# Patient Record
Sex: Male | Born: 1940 | Race: White | Hispanic: No | Marital: Married | Smoking: Former smoker
Health system: Southern US, Community
[De-identification: ages and names within clinical notes are randomized; demographics above are authoritative.]

## PROBLEM LIST (undated history)

## (undated) DIAGNOSIS — I10 Essential (primary) hypertension: Secondary | ICD-10-CM

## (undated) DIAGNOSIS — E7800 Pure hypercholesterolemia, unspecified: Secondary | ICD-10-CM

## (undated) DIAGNOSIS — G2581 Restless legs syndrome: Secondary | ICD-10-CM

## (undated) DIAGNOSIS — M069 Rheumatoid arthritis, unspecified: Secondary | ICD-10-CM

## (undated) HISTORY — PX: APPENDECTOMY: SHX54

## (undated) HISTORY — PX: PROSTATE SURGERY: SHX751

---

## 2011-09-10 ENCOUNTER — Encounter (HOSPITAL_COMMUNITY): Payer: Self-pay | Admitting: *Deleted

## 2011-09-10 ENCOUNTER — Emergency Department (HOSPITAL_COMMUNITY): Payer: Medicare Other

## 2011-09-10 ENCOUNTER — Emergency Department (HOSPITAL_COMMUNITY)
Admission: EM | Admit: 2011-09-10 | Discharge: 2011-09-10 | Disposition: A | Discharge status: Home / Self Care | Payer: Medicare Other | Attending: Emergency Medicine | Admitting: Emergency Medicine

## 2011-09-10 DIAGNOSIS — I10 Essential (primary) hypertension: Secondary | ICD-10-CM | POA: Insufficient documentation

## 2011-09-10 DIAGNOSIS — J069 Acute upper respiratory infection, unspecified: Secondary | ICD-10-CM | POA: Insufficient documentation

## 2011-09-10 DIAGNOSIS — E789 Disorder of lipoprotein metabolism, unspecified: Secondary | ICD-10-CM | POA: Insufficient documentation

## 2011-09-10 DIAGNOSIS — E119 Type 2 diabetes mellitus without complications: Secondary | ICD-10-CM | POA: Insufficient documentation

## 2011-09-10 DIAGNOSIS — R05 Cough: Secondary | ICD-10-CM | POA: Insufficient documentation

## 2011-09-10 DIAGNOSIS — R059 Cough, unspecified: Secondary | ICD-10-CM | POA: Insufficient documentation

## 2011-09-10 HISTORY — DX: Pure hypercholesterolemia, unspecified: E78.00

## 2011-09-10 HISTORY — DX: Essential (primary) hypertension: I10

## 2011-09-10 HISTORY — DX: Rheumatoid arthritis, unspecified: M06.9

## 2011-09-10 HISTORY — DX: Restless legs syndrome: G25.81

## 2011-09-10 MED ORDER — ALBUTEROL SULFATE HFA 108 (90 BASE) MCG/ACT IN AERS: 2.0000 | INHALATION_SPRAY | RESPIRATORY_TRACT | Status: DC | PRN

## 2011-09-10 NOTE — ED Notes (Signed)
Productive cough x 9 days.  States worse at night when lying flat.

## 2011-09-10 NOTE — ED Provider Notes (Signed)
History   This chart was scribed for Francisco Co, MD by Clarita Crane. The patient was seen in room APA11/APA11 and the patient's care was started at 10:26AM.   CSN: [id number]  Arrival date & time 09/10/11  0950   First MD Initiated Contact with Patient 09/10/11 [phone]      Chief Complaint  Patient presents with  . Cough    (Consider location/radiation/quality/duration/timing/severity/associated sxs/prior treatment) HPI Francisco Atkins is a 71 y.o. male who presents to the Emergency Department complaining of moderate nocturnal, productive cough onset 9 days ago and persistent since with associated nasal congestion/drainage. Patient notes blood glucose levels have not been well controlled as it has been running within the mid to high 200's recently. Denies fever, chills, SOB, orthopnea. Patient with h/o diabetes, HTN, high cholesterol.   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 Diabetes mellitus   . Restless leg syndrome   . High cholesterol   . Rheumatoid arthritis   . Hypertension     Past Surgical History  Procedure Date  . Appendectomy   . Prostate surgery     No family history on file.  History  Substance Use Topics  . Smoking status: Former Games developer  . Smokeless tobacco: Not on file  . Alcohol Use: No      Review of Systems 10 Systems reviewed and are negative for acute change except as noted in the HPI.  Allergies  Review of patient's allergies indicates no known allergies.  Home Medications  No current outpatient prescriptions on file.  BP 163/69  Pulse 95  Temp(Src) 98.7 F (37.1 C) (Oral)  Resp 18  Ht 5\' 6"  (1.676 m)  Wt 195 lb (88.451 kg)  BMI 31.47 kg/m2  SpO2 97%  Physical Exam  Nursing note and vitals reviewed. Constitutional: He is oriented to person, place, and time. He appears well-developed and well-nourished. No distress.  HENT:  Head: Normocephalic and atraumatic.  Mouth/Throat: Oropharynx is clear and moist. No  oropharyngeal exudate.       TMs normal bilaterally. Oropharynx normal.  Eyes: EOM are normal. Pupils are equal, round, and reactive to light.  Neck: Normal range of motion. Neck supple. No tracheal deviation present.  Cardiovascular: Normal rate and regular rhythm.  Exam reveals no gallop and no friction rub.   No murmur heard. Pulmonary/Chest: Effort normal. No respiratory distress. He has no wheezes. He has no rales.  Abdominal: Soft. He exhibits no distension.  Musculoskeletal: Normal range of motion. He exhibits no edema.  Lymphadenopathy:    He has no cervical adenopathy.  Neurological: He is alert and oriented to person, place, and time. No sensory deficit.  Skin: Skin is warm and dry.  Psychiatric: He has a normal mood and affect. His behavior is normal.    ED Course  Procedures (including critical care time)  DIAGNOSTIC STUDIES: Oxygen Saturation is 97% on room air, normal by my interpretation.    COORDINATION OF CARE: 10:31AM- Patient explained plan for treatment within ED. Patient agrees with plan set forth at this time.    Labs Reviewed - No data to display No results found. I personally reviewed the chest x-ray.  No evidence of acute pulmonary infiltrate or other acute pathology to  1. Cough 2. URI    MDM  The patient is well-appearing.  His vital signs are normal.  His pulmonary exam is normal.  His chest x-ray is clear.  The patient has significant postnasal drip and URI like symptoms.  His had no relief with over-the-counter medications.  He was sent home with an albuterol inhaler to help with cough however I suspect his cough will continue until his URI symptoms improved.  He's been instructed to followup closely with his doctor      I personally performed the services described in this documentation, which was scribed in my presence. The recorded information has been reviewed and considered.    Francisco Co, MD 09/10/11 [phone]

## 2011-09-28 ENCOUNTER — Encounter (HOSPITAL_COMMUNITY): Payer: Self-pay | Admitting: Dietician

## 2011-09-28 NOTE — Progress Notes (Signed)
[facility] Diabetes Class Completion  Date:September 28, 2011  Time: 10:00 AM  Pt and wife, Francisco Atkins, attended [facility]'s Diabetes Class on September 28, 2011.   Patient was educated on the following topics: carbohydrate metabolism in relation to diabetes, sources of carbohydrate, carbohydrate counting, meal planning strategies, food label reading, and portion control.   Melody Haver, RD, LDN Date:September 28, 2011 Time: 10:00 AM

## 2014-06-06 ENCOUNTER — Emergency Department (HOSPITAL_COMMUNITY)
Admission: EM | Admit: 2014-06-06 | Discharge: 2014-06-06 | Disposition: A | Discharge status: Home / Self Care | Payer: Medicare Other | Attending: Emergency Medicine | Admitting: Emergency Medicine

## 2014-06-06 ENCOUNTER — Encounter (HOSPITAL_COMMUNITY): Payer: Self-pay | Admitting: Emergency Medicine

## 2014-06-06 ENCOUNTER — Emergency Department (HOSPITAL_COMMUNITY): Payer: Medicare Other

## 2014-06-06 DIAGNOSIS — M1990 Unspecified osteoarthritis, unspecified site: Secondary | ICD-10-CM | POA: Insufficient documentation

## 2014-06-06 DIAGNOSIS — Z87891 Personal history of nicotine dependence: Secondary | ICD-10-CM | POA: Diagnosis not present

## 2014-06-06 DIAGNOSIS — J029 Acute pharyngitis, unspecified: Secondary | ICD-10-CM | POA: Diagnosis not present

## 2014-06-06 DIAGNOSIS — E119 Type 2 diabetes mellitus without complications: Secondary | ICD-10-CM | POA: Diagnosis not present

## 2014-06-06 DIAGNOSIS — Z8669 Personal history of other diseases of the nervous system and sense organs: Secondary | ICD-10-CM | POA: Insufficient documentation

## 2014-06-06 DIAGNOSIS — Z794 Long term (current) use of insulin: Secondary | ICD-10-CM | POA: Insufficient documentation

## 2014-06-06 DIAGNOSIS — Z79899 Other long term (current) drug therapy: Secondary | ICD-10-CM | POA: Diagnosis not present

## 2014-06-06 DIAGNOSIS — E780 Pure hypercholesterolemia: Secondary | ICD-10-CM | POA: Insufficient documentation

## 2014-06-06 DIAGNOSIS — I10 Essential (primary) hypertension: Secondary | ICD-10-CM | POA: Diagnosis not present

## 2014-06-06 DIAGNOSIS — Z7982 Long term (current) use of aspirin: Secondary | ICD-10-CM | POA: Insufficient documentation

## 2014-06-06 DIAGNOSIS — M542 Cervicalgia: Secondary | ICD-10-CM | POA: Insufficient documentation

## 2014-06-06 LAB — BASIC METABOLIC PANEL
ANION GAP: 12 (ref 5–15)
BUN: 19 mg/dL (ref 6–23)
CALCIUM: 9.5 mg/dL (ref 8.4–10.5)
CO2: 30 meq/L (ref 19–32)
Chloride: 97 mEq/L (ref 96–112)
Creatinine, Ser: 1.01 mg/dL (ref 0.50–1.35)
GFR calc Af Amer: 85 mL/min — ABNORMAL LOW (ref >90)
GFR calc non Af Amer: 73 mL/min — ABNORMAL LOW (ref >90)
Glucose, Bld: 185 mg/dL — ABNORMAL HIGH (ref 70–99)
Potassium: 4.2 mEq/L (ref 3.7–5.3)
SODIUM: 139 meq/L (ref 137–147)

## 2014-06-06 LAB — CBC WITH DIFFERENTIAL/PLATELET
BASOS ABS: 0 10*3/uL (ref 0.0–0.1)
Basophils Relative: 0 % (ref 0–1)
EOS PCT: 1 % (ref 0–5)
Eosinophils Absolute: 0.1 10*3/uL (ref 0.0–0.7)
HCT: 36.1 % — ABNORMAL LOW (ref 39.0–52.0)
Hemoglobin: 12.1 g/dL — ABNORMAL LOW (ref 13.0–17.0)
LYMPHS PCT: 9 % — AB (ref 12–46)
Lymphs Abs: 1 10*3/uL (ref 0.7–4.0)
MCH: 31.1 pg (ref 26.0–34.0)
MCHC: 33.5 g/dL (ref 30.0–36.0)
MCV: 92.8 fL (ref 78.0–100.0)
Monocytes Absolute: 1 10*3/uL (ref 0.1–1.0)
Monocytes Relative: 9 % (ref 3–12)
NEUTROS ABS: 8.9 10*3/uL — AB (ref 1.7–7.7)
Neutrophils Relative %: 81 % — ABNORMAL HIGH (ref 43–77)
PLATELETS: 203 10*3/uL (ref 150–400)
RBC: 3.89 MIL/uL — ABNORMAL LOW (ref 4.22–5.81)
RDW: 14.2 % (ref 11.5–15.5)
WBC: 11 10*3/uL — AB (ref 4.0–10.5)

## 2014-06-06 LAB — RAPID STREP SCREEN (MED CTR MEBANE ONLY): STREPTOCOCCUS, GROUP A SCREEN (DIRECT): NEGATIVE

## 2014-06-06 MED ORDER — IOHEXOL 300 MG/ML  SOLN
75.0000 mL | Freq: Once | INTRAMUSCULAR | Status: AC | PRN
  Administered 2014-06-06: 75 mL via INTRAVENOUS

## 2014-06-06 MED ORDER — HYDROCODONE-ACETAMINOPHEN 5-325 MG PO TABS: 1.0000 | ORAL_TABLET | Freq: Four times a day (QID) | ORAL | Status: DC | PRN

## 2014-06-06 NOTE — Discharge Instructions (Signed)
Follow up with your md about your thyroid gland

## 2014-06-06 NOTE — ED Provider Notes (Signed)
CSN: [id number]     Arrival date & time 06/06/14  1058 History  This chart was scribed for Benny LennertJoseph L Shalom Mcguiness, MD by Leone PayorSonum Patel, ED Scribe. This patient was seen in room APA04/APA04 and the patient's care was started 12:22 PM.    Chief Complaint  Patient presents with  . Torticollis    Patient is a 73 y.o. male presenting with pharyngitis. The history is provided by the patient. No language interpreter was used.  Sore Throat This is a new problem. The current episode started more than 2 days ago. The problem occurs constantly. The problem has not changed since onset.Pertinent negatives include no chest pain, no abdominal pain and no headaches. He has tried nothing for the symptoms.    HPI Comments: Francisco Atkins is a 73 y.o. male who presents to the Emergency Department complaining of constant, unchanged posterior neck pain that began 2 days. He reports working on his tractor recently and believes he may have strained it. He states the pain is worse with movement and describes the pain as a soreness. He denies extremity weakness or numbness.   He also complains of a constant sore throat for the past 4 days.   Past Medical History  Diagnosis Date  . Diabetes mellitus   . Restless leg syndrome   . High cholesterol   . Rheumatoid arthritis(714.0)   . Hypertension    Past Surgical History  Procedure Laterality Date  . Appendectomy    . Prostate surgery     History reviewed. No pertinent family history. History  Substance Use Topics  . Smoking status: Former Games developermoker  . Smokeless tobacco: Not on file  . Alcohol Use: No    Review of Systems  Constitutional: Negative for appetite change and fatigue.  HENT: Positive for sore throat. Negative for congestion, ear discharge and sinus pressure.   Eyes: Negative for discharge.  Respiratory: Negative for cough.   Cardiovascular: Negative for chest pain.  Gastrointestinal: Negative for abdominal pain and diarrhea.  Genitourinary: Negative for  frequency and hematuria.  Musculoskeletal: Positive for neck pain. Negative for back pain.  Skin: Negative for rash.  Neurological: Negative for seizures and headaches.  Psychiatric/Behavioral: Negative for hallucinations.      Allergies  Review of patient's allergies indicates no known allergies.  Home Medications   Prior to Admission medications   Medication Sig Start Date End Date Taking? Authorizing Provider  aspirin 81 MG chewable tablet Chew 81 mg by mouth daily.   Yes Historical Provider, MD  atorvastatin (LIPITOR) 10 MG tablet Take 1 tablet by mouth daily. 04/06/14  Yes Historical Provider, MD  carbidopa-levodopa (SINEMET) 25-100 MG per tablet Take 1 tablet by mouth at bedtime as needed (restless legs). Very seldom takes   Yes Historical Provider, MD  cholecalciferol (VITAMIN D) 1000 UNITS tablet Take 1,000 Units by mouth daily.   Yes Historical Provider, MD  clobetasol (TEMOVATE) 0.05 % external solution Apply 1 application topically daily as needed. For affected areas on scalp   Yes Historical Provider, MD  ferrous sulfate 325 (65 FE) MG tablet Take 325 mg by mouth 2 (two) times daily with a meal.   Yes Historical Provider, MD  folic acid (FOLVITE) 1 MG tablet Take 1 mg by mouth daily.   Yes Historical Provider, MD  hydrochlorothiazide (HYDRODIURIL) 25 MG tablet Take 25 mg by mouth daily.   Yes Historical Provider, MD  insulin aspart (NOVOLOG) 100 UNIT/ML injection Inject 4-6 Units into the skin 3 (three) times daily  before meals. Patient took 6 units this morning   Yes Historical Provider, MD  insulin glargine (LANTUS) 100 UNIT/ML injection Inject 43 Units into the skin at bedtime.    Yes Historical Provider, MD  ketoprofen (ORUDIS) 75 MG capsule Take 75 mg by mouth 3 (three) times daily as needed. rheumatoid arthritis   Yes Historical Provider, MD  metFORMIN (GLUCOPHAGE) 1000 MG tablet Take 1,000 mg by mouth 2 (two) times daily with a meal.   Yes Historical Provider, MD   methotrexate (RHEUMATREX) 2.5 MG tablet Take 2.5 mg by mouth once a week. Caution:Chemotherapy. Protect from light. Take on Mondays.   Yes Historical Provider, MD  Phenyleph-Doxylamine-DM-APAP (ALKA SELTZER PLUS PO) Take 1 tablet by mouth daily as needed (burning).   Yes Historical Provider, MD  quinapril (ACCUPRIL) 40 MG tablet Take 40 mg by mouth at bedtime.   Yes Historical Provider, MD   BP 136/59  Pulse 103  Temp(Src) 99.1 F (37.3 C) (Oral)  Resp 18  Ht 5\' 6"  (1.676 m)  Wt 193 lb (87.544 kg)  BMI 31.17 kg/m2  SpO2 99% Physical Exam  Nursing note and vitals reviewed. Constitutional: He is oriented to person, place, and time. He appears well-developed.  HENT:  Head: Normocephalic.  Eyes: Conjunctivae and EOM are normal. No scleral icterus.  Neck: Neck supple. No thyromegaly present.  Minor tenderness in anterior and posterior neck   Cardiovascular: Normal rate and regular rhythm.  Exam reveals no gallop and no friction rub.   No murmur heard. Pulmonary/Chest: No stridor. He has no wheezes. He has no rales. He exhibits no tenderness.  Abdominal: He exhibits no distension. There is no tenderness. There is no rebound.  Musculoskeletal: Normal range of motion. He exhibits no edema.  Lymphadenopathy:    He has no cervical adenopathy.  Neurological: He is oriented to person, place, and time. He exhibits normal muscle tone. Coordination normal.  Skin: No rash noted. No erythema.  Psychiatric: He has a normal mood and affect. His behavior is normal.    ED Course  Procedures (including critical care time)  DIAGNOSTIC STUDIES: Oxygen Saturation is 99% on RA, normal by my interpretation.    COORDINATION OF CARE: 12:27 PM Discussed treatment plan with pt at bedside and pt agreed to plan.   Labs Review Labs Reviewed - No data to display  Imaging Review No results found.   EKG Interpretation   Date/Time:  Thursday June 06 2014 11:28:35 EDT Ventricular Rate:  105 PR  Interval:  156 QRS Duration: 78 QT Interval:  339 QTC Calculation: 448 R Axis:   61 Text Interpretation:  Sinus tachycardia Borderline ST elevation,  anterolateral leads Confirmed by Nikko Quast  MD, Nickalous Stingley ([id number]) on 06/06/2014  3:41:34 PM      MDM   Final diagnoses:  None   Neck pain and thyroid nodules.  Pt to see pcp The chart was scribed for me under my direct supervision.  I personally performed the history, physical, and medical decision making and all procedures in the evaluation of this patient.Benny Lennert.   Keyondre Hepburn L Drinda Belgard, MD 06/06/14 [phone]

## 2014-06-06 NOTE — ED Notes (Signed)
Patient with no complaints at this time. Respirations even and unlabored. Skin warm/dry. Discharge instructions reviewed with patient at this time. Patient given opportunity to voice concerns/ask questions. IV removed per policy and band-aid applied to site. Patient discharged at this time and left Emergency Department with steady gait.  

## 2014-06-06 NOTE — ED Notes (Signed)
Patient states neck pain x 2 days, similar to arthritis pain he has had there before. States sore throat x 4 days. Patient states he was working in the garden all day yesterday and might have strained it then.

## 2014-06-06 NOTE — ED Notes (Addendum)
Neck and throat  pain times 3 days.  Worse with movement.

## 2014-06-08 LAB — CULTURE, GROUP A STREP

## 2015-10-11 IMAGING — CT CT NECK W/ CM
4 of 8 series · 11 of 33 positions shown, 12 images · IV contrast (omnipaque)
Comparison: None.

CLINICAL DATA: Neck stiffness for 3-4 days, history of prostate
cancer 8 years ago

EXAM:
CT NECK WITH CONTRAST
TECHNIQUE: Multidetector CT imaging of the neck was performed using the
standard protocol following the bolus administration of intravenous
contrast.
CONTRAST:  75mL OMNIPAQUE IOHEXOL 300 MG/ML  SOLN

[Series 2: soft tissue neck 2.0 b31s · axial · 0.46mm/px · z∈[+1102,+1198]mm · 3 of 97 slices shown, 4 images]
[im 25/97  soft-tissue]
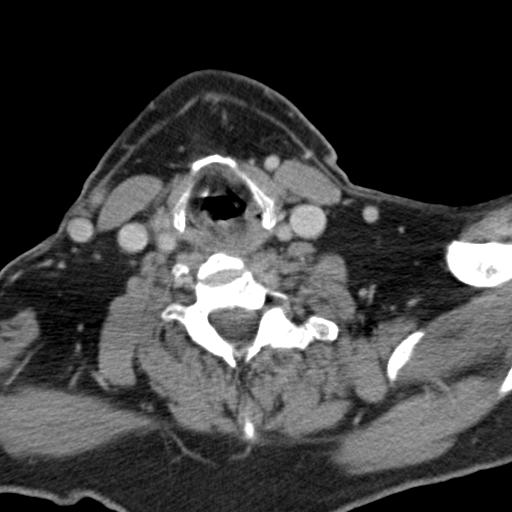
[im 25/97  bone]
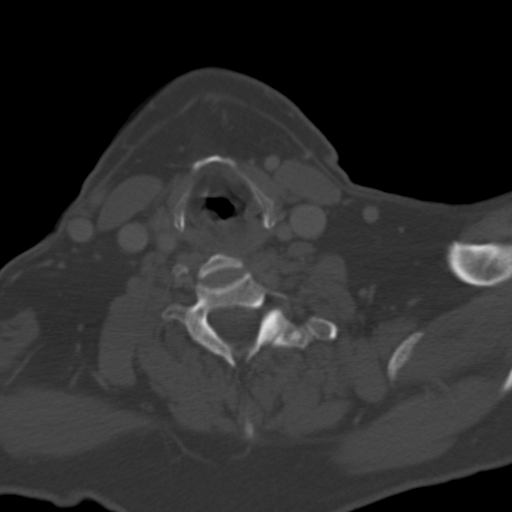
[im 49/97  bone]
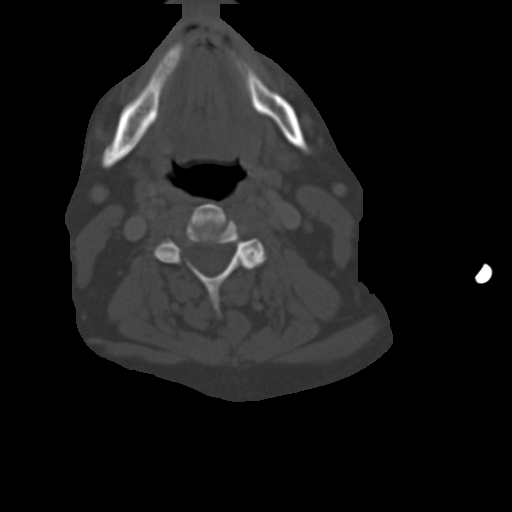
[im 73/97  bone]
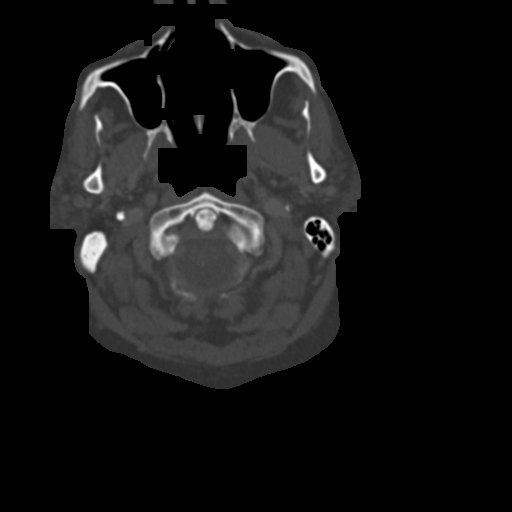

[Series 5: neck 2.0 soft tissue coro · coronal · 0.38mm/px · 1 of 95 slices shown]
[im 48/95  bone]
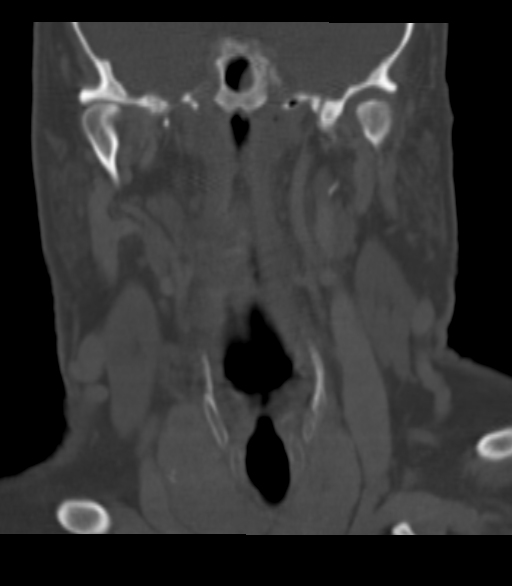

[Series 6: axial soft tissue neck 2.0 · axial · 0.41mm/px · z∈[+1082,+1135]mm · 2 of 107 slices shown]
[im 27/107  soft-tissue]
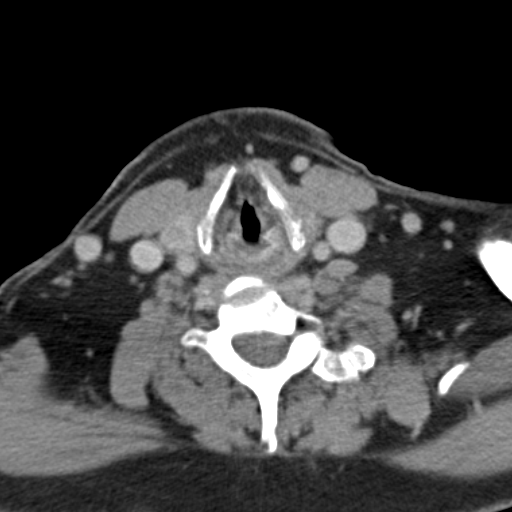
[im 54/107  soft-tissue]
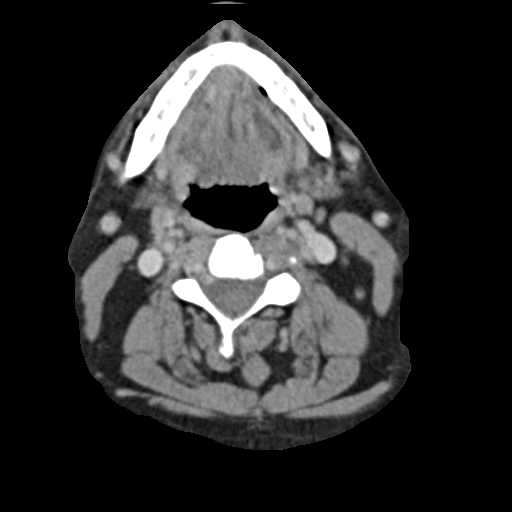

[Series 9: neck 2.0 soft tissue sag · sagittal · 0.13mm/px · 5 of 118 slices shown]
[im 17/118  bone]
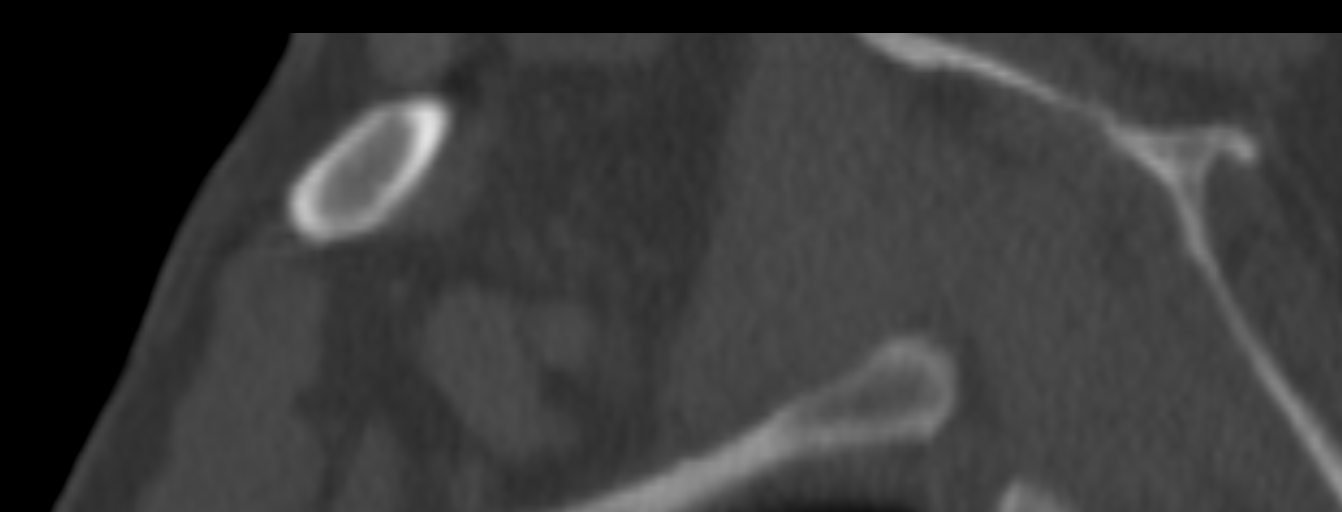
[im 34/118  bone]
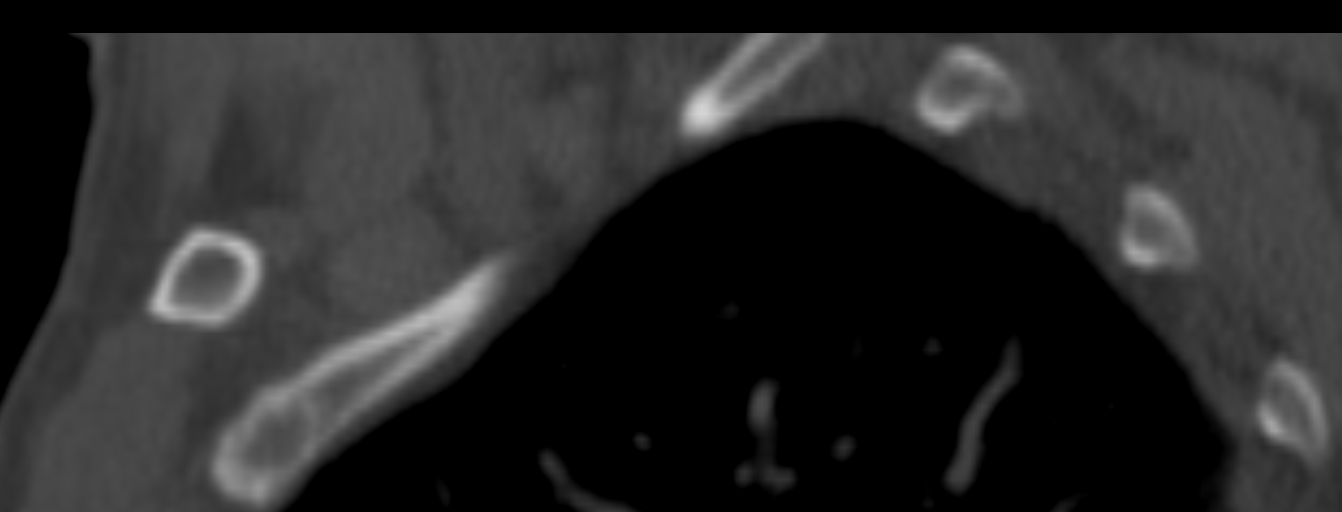
[im 51/118  bone]
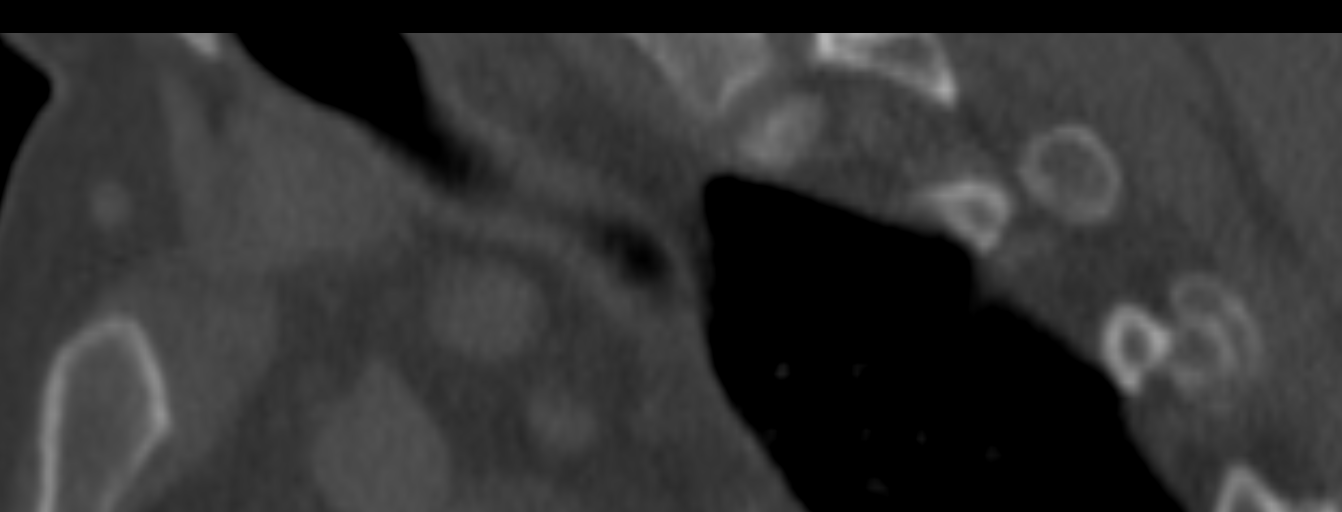
[im 67/118  bone]
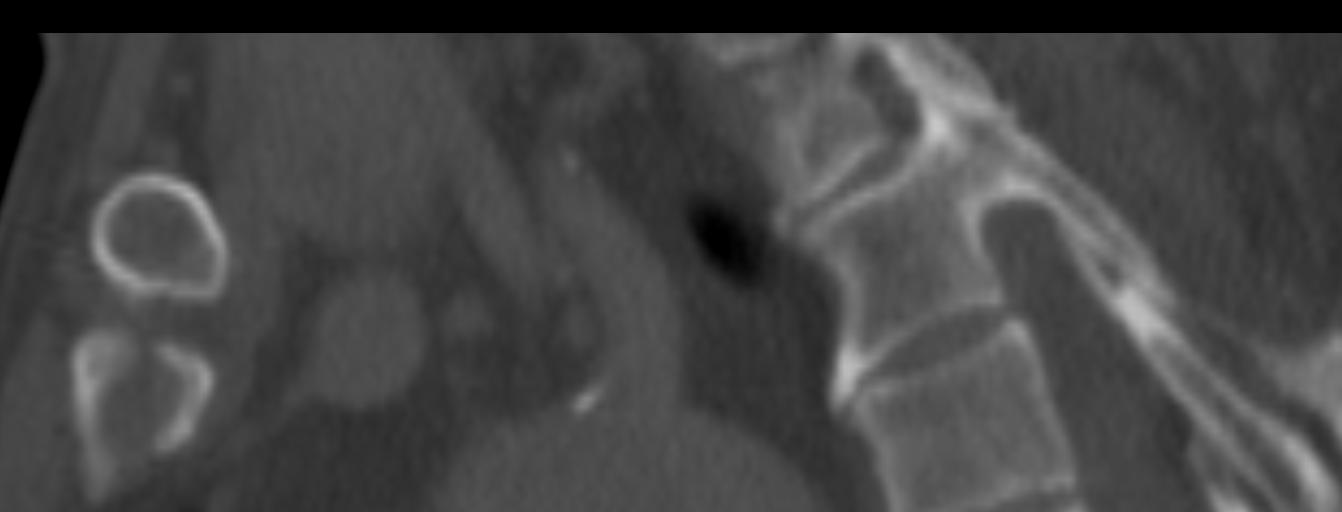
[im 84/118  bone]
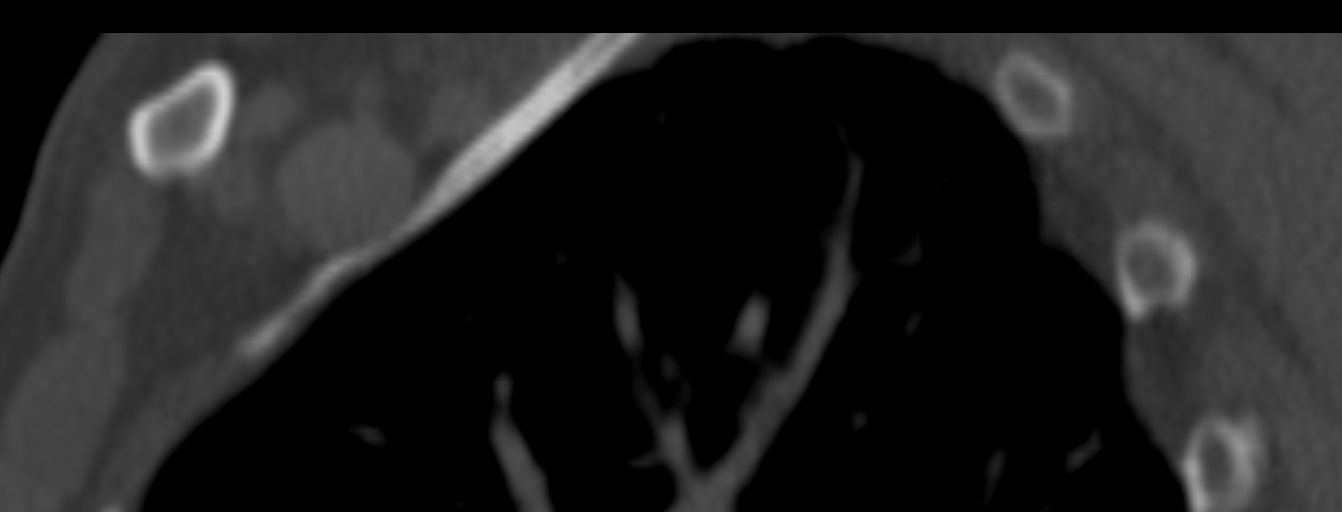

[11 of 33 positions shown; findings below may reference images not displayed]

FINDINGS: There is no lymphadenopathy. There is no nasopharyngeal or
oropharyngeal mass. There is no focal fluid collection. The airway
is patent. The visualized portions of the carotid arteries and
internal jugular veins are patent.

The visualized portions of the brain demonstrate no focal
abnormality.

The paranasal sinuses are clear.

The lung apices are clear.

Bilateral small thyroid nodules with the right thyroid nodule
measuring 14 mm and a left nodule measuring 15 mm.

There is no lytic or blastic osseous lesion. The disc spaces are
maintained. There is no acute fracture or static listhesis.
Bilateral sternocleidomastoid muscles are symmetric without
thickening.
IMPRESSION: 1. No acute abnormality of the neck.
2. Bilateral thyroid nodules. If there is further clinical concern,
a dedicated thyroid ultrasound is recommended.

## 2020-02-06 ENCOUNTER — Encounter (HOSPITAL_COMMUNITY): Payer: Self-pay | Admitting: Emergency Medicine

## 2020-02-06 ENCOUNTER — Other Ambulatory Visit: Payer: Self-pay

## 2020-02-06 ENCOUNTER — Emergency Department (HOSPITAL_COMMUNITY)
Admission: EM | Admit: 2020-02-06 | Discharge: 2020-02-06 | Disposition: A | Discharge status: Home / Self Care | Payer: Medicare Other | Attending: Emergency Medicine | Admitting: Emergency Medicine

## 2020-02-06 ENCOUNTER — Emergency Department (HOSPITAL_COMMUNITY): Payer: Medicare Other

## 2020-02-06 DIAGNOSIS — Z7982 Long term (current) use of aspirin: Secondary | ICD-10-CM | POA: Diagnosis not present

## 2020-02-06 DIAGNOSIS — E119 Type 2 diabetes mellitus without complications: Secondary | ICD-10-CM | POA: Insufficient documentation

## 2020-02-06 DIAGNOSIS — N201 Calculus of ureter: Secondary | ICD-10-CM | POA: Insufficient documentation

## 2020-02-06 DIAGNOSIS — I10 Essential (primary) hypertension: Secondary | ICD-10-CM | POA: Diagnosis not present

## 2020-02-06 DIAGNOSIS — N179 Acute kidney failure, unspecified: Secondary | ICD-10-CM | POA: Diagnosis not present

## 2020-02-06 DIAGNOSIS — Z87891 Personal history of nicotine dependence: Secondary | ICD-10-CM | POA: Diagnosis not present

## 2020-02-06 DIAGNOSIS — R109 Unspecified abdominal pain: Secondary | ICD-10-CM | POA: Diagnosis present

## 2020-02-06 LAB — CBC WITH DIFFERENTIAL/PLATELET
Abs Immature Granulocytes: 0.12 10*3/uL — ABNORMAL HIGH (ref 0.00–0.07)
Basophils Absolute: 0 10*3/uL (ref 0.0–0.1)
Basophils Relative: 0 %
Eosinophils Absolute: 0.1 10*3/uL (ref 0.0–0.5)
Eosinophils Relative: 0 %
HCT: 38.8 % — ABNORMAL LOW (ref 39.0–52.0)
Hemoglobin: 12.6 g/dL — ABNORMAL LOW (ref 13.0–17.0)
Immature Granulocytes: 1 %
Lymphocytes Relative: 7 %
Lymphs Abs: 0.9 10*3/uL (ref 0.7–4.0)
MCH: 32.1 pg (ref 26.0–34.0)
MCHC: 32.5 g/dL (ref 30.0–36.0)
MCV: 98.7 fL (ref 80.0–100.0)
Monocytes Absolute: 1 10*3/uL (ref 0.1–1.0)
Monocytes Relative: 7 %
Neutro Abs: 10.9 10*3/uL — ABNORMAL HIGH (ref 1.7–7.7)
Neutrophils Relative %: 85 %
Platelets: 175 10*3/uL (ref 150–400)
RBC: 3.93 MIL/uL — ABNORMAL LOW (ref 4.22–5.81)
RDW: 13.6 % (ref 11.5–15.5)
WBC: 13 10*3/uL — ABNORMAL HIGH (ref 4.0–10.5)
nRBC: 0 % (ref 0.0–0.2)

## 2020-02-06 LAB — URINALYSIS, ROUTINE W REFLEX MICROSCOPIC
Bilirubin Urine: NEGATIVE
Glucose, UA: 50 mg/dL — AB
Ketones, ur: NEGATIVE mg/dL
Leukocytes,Ua: NEGATIVE
Nitrite: NEGATIVE
Protein, ur: 30 mg/dL — AB
RBC / HPF: >50 RBC/hpf — ABNORMAL HIGH (ref 0–5)
Specific Gravity, Urine: 1.024 (ref 1.005–1.030)
pH: 5 (ref 5.0–8.0)

## 2020-02-06 LAB — COMPREHENSIVE METABOLIC PANEL
ALT: 37 U/L (ref 0–44)
AST: 27 U/L (ref 15–41)
Albumin: 4.1 g/dL (ref 3.5–5.0)
Alkaline Phosphatase: 51 U/L (ref 38–126)
Anion gap: 13 (ref 5–15)
BUN: 29 mg/dL — ABNORMAL HIGH (ref 8–23)
CO2: 26 mmol/L (ref 22–32)
Calcium: 9.4 mg/dL (ref 8.9–10.3)
Chloride: 98 mmol/L (ref 98–111)
Creatinine, Ser: 1.93 mg/dL — ABNORMAL HIGH (ref 0.61–1.24)
GFR calc Af Amer: 38 mL/min — ABNORMAL LOW (ref >60)
GFR calc non Af Amer: 33 mL/min — ABNORMAL LOW (ref >60)
Glucose, Bld: 209 mg/dL — ABNORMAL HIGH (ref 70–99)
Potassium: 4.4 mmol/L (ref 3.5–5.1)
Sodium: 137 mmol/L (ref 135–145)
Total Bilirubin: 0.4 mg/dL (ref 0.3–1.2)
Total Protein: 7.7 g/dL (ref 6.5–8.1)

## 2020-02-06 MED ORDER — HYDROCODONE-ACETAMINOPHEN 5-325 MG PO TABS: 2.0000 | ORAL_TABLET | ORAL | 0 refills | Status: DC | PRN

## 2020-02-06 MED ORDER — SODIUM CHLORIDE 0.9 % IV BOLUS: 1000.0000 mL | Freq: Once | INTRAVENOUS | Status: DC

## 2020-02-06 MED ORDER — HYDROCODONE-ACETAMINOPHEN 5-325 MG PO TABS: 1.0000 | ORAL_TABLET | Freq: Four times a day (QID) | ORAL | 0 refills | Status: DC | PRN

## 2020-02-06 NOTE — Discharge Instructions (Addendum)
Be sure to drink plenty of fluids.  Your kidney function is a little worse today than typical, your creatinine is 1.9.  If you develop fever, urinary tract infection symptoms, vomiting, new or worsening or uncontrolled pain, or any other new/concerning symptoms then return to the ER for evaluation.  Because of your kidneys, do not take ketoprofen or other NSAIDs such as Ibuprofen/Advil/Aleve/Motrin/Goody Powders/Naproxen/BC powders/Meloxicam/Diclofenac/Indomethacin and other Nonsteroidal anti-inflammatory medications

## 2020-02-06 NOTE — ED Triage Notes (Signed)
Pt c/o RIGHT flank pain since yesterday. Pt concerned for a kidney stone.

## 2020-02-06 NOTE — ED Provider Notes (Signed)
[facility] EMERGENCY DEPARTMENT Provider Note   CSN: [id number] Arrival date & time: 02/06/20  1839     History Chief Complaint  Patient presents with  . Flank Pain    right    Francisco Atkins is a 79 y.o. male.  HPI 79 year old male presents with right flank pain.  8 days ago he noticed hematuria and has had light pink urine ever since.  No pain with it.  Saw his urologist about 5 days ago and was told he would need a CT.  This has not happened yet.  Today he noticed right flank pain going into his right lower abdomen.  Took a ketoprofen with complete relief.  No nausea, vomiting, fever.  Prior to this he was not able to find a comfortable position and the pain was severe.  No dysuria.   Past Medical History:  Diagnosis Date  . Diabetes mellitus   . High cholesterol   . Hypertension   . Restless leg syndrome   . Rheumatoid arthritis(714.0)     There are no problems to display for this patient.   Past Surgical History:  Procedure Laterality Date  . APPENDECTOMY    . PROSTATE SURGERY         History reviewed. No pertinent family history.  Social History   Tobacco Use  . Smoking status: Former Smoker  Substance Use Topics  . Alcohol use: No  . Drug use: No    Home Medications Prior to Admission medications   Medication Sig Start Date End Date Taking? Authorizing Provider  aspirin 81 MG chewable tablet Chew 81 mg by mouth daily.   Yes [provider]  atorvastatin (LIPITOR) 40 MG tablet Take 40 mg by mouth every morning.  04/06/14  Yes [provider]  carbidopa-levodopa (SINEMET) 25-100 MG per tablet Take 1 tablet by mouth at bedtime as needed (restless legs). Very seldom takes   Yes [provider]  cholecalciferol (VITAMIN D) 1000 UNITS tablet Take 1,000 Units by mouth daily.   Yes [provider]  etanercept (ENBREL) 50 MG/ML injection Inject 50 mg into the skin every Saturday.   Yes [provider]  ferrous sulfate  325 (65 FE) MG tablet Take 325 mg by mouth 2 (two) times daily with a meal.   Yes [provider]  folic acid (FOLVITE) 1 MG tablet Take 1 mg by mouth every morning.    Yes [provider]  HUMALOG KWIKPEN 100 UNIT/ML KwikPen Inject 5-17 Units into the skin in the morning, at noon, and at bedtime. 5 after breakfast, 12 at lunch, and 17 at dinner. 10/26/19  Yes [provider]  hydrochlorothiazide (HYDRODIURIL) 25 MG tablet Take 25 mg by mouth every morning.    Yes [provider]  insulin glargine (LANTUS) 100 UNIT/ML injection Inject 57 Units into the skin at bedtime.    Yes [provider]  metFORMIN (GLUCOPHAGE) 1000 MG tablet Take 1,000 mg by mouth 2 (two) times daily with a meal.   Yes [provider]  methotrexate (RHEUMATREX) 2.5 MG tablet Take 10 mg by mouth every Monday.    Yes [provider]  quinapril (ACCUPRIL) 40 MG tablet Take 40 mg by mouth at bedtime.   Yes [provider]  solifenacin (VESICARE) 5 MG tablet Take 5 mg by mouth every morning.   Yes [provider]  HYDROcodone-acetaminophen (NORCO) 5-325 MG tablet Take 1 tablet by mouth every 6 (six) hours as needed for severe pain. 02/06/20  Pricilla Loveless, MD  HYDROcodone-acetaminophen (NORCO/VICODIN) 5-325 MG tablet Take 2 tablets by mouth every 4 (four) hours as needed. 02/06/20   Pricilla Loveless, MD    Allergies    Patient has no known allergies.  Review of Systems   Review of Systems  Constitutional: Negative for fever.  Gastrointestinal: Positive for abdominal pain. Negative for vomiting.  Genitourinary: Positive for flank pain and hematuria. Negative for dysuria.  All other systems reviewed and are negative.   Physical Exam Updated Vital Signs BP (!) 155/71 (BP Location: Right Arm)   Pulse 81   Temp 98.9 F (37.2 C) (Oral)   Resp 15   Ht 5\' 6"  (1.676 m)   Wt 91.6 kg   SpO2 94%   BMI 32.60 kg/m   Physical Exam Vitals and  nursing note reviewed.  Constitutional:      General: He is not in acute distress.    Appearance: He is well-developed. He is obese. He is not ill-appearing or diaphoretic.  HENT:     Head: Normocephalic and atraumatic.     Right Ear: External ear normal.     Left Ear: External ear normal.     Nose: Nose normal.  Eyes:     General:        Right eye: No discharge.        Left eye: No discharge.  Cardiovascular:     Rate and Rhythm: Normal rate and regular rhythm.     Heart sounds: Normal heart sounds.  Pulmonary:     Effort: Pulmonary effort is normal.     Breath sounds: Normal breath sounds.  Abdominal:     Palpations: Abdomen is soft.     Tenderness: There is no abdominal tenderness. There is no right CVA tenderness or left CVA tenderness.  Musculoskeletal:     Cervical back: Neck supple.  Skin:    General: Skin is warm and dry.  Neurological:     Mental Status: He is alert.  Psychiatric:        Mood and Affect: Mood is not anxious.     ED Results / Procedures / Treatments   Labs (all labs ordered are listed, but only abnormal results are displayed) Labs Reviewed  COMPREHENSIVE METABOLIC PANEL - Abnormal; Notable for the following components:      Result Value   Glucose, Bld 209 (*)    BUN 29 (*)    Creatinine, Ser 1.93 (*)    GFR calc non Af Amer 33 (*)    GFR calc Af Amer 38 (*)    All other components within normal limits  CBC WITH DIFFERENTIAL/PLATELET - Abnormal; Notable for the following components:   WBC 13.0 (*)    RBC 3.93 (*)    Hemoglobin 12.6 (*)    HCT 38.8 (*)    Neutro Abs 10.9 (*)    Abs Immature Granulocytes 0.12 (*)    All other components within normal limits  URINALYSIS, ROUTINE W REFLEX MICROSCOPIC - Abnormal; Notable for the following components:   APPearance TURBID (*)    Glucose, UA 50 (*)    Hgb urine dipstick LARGE (*)    Protein, ur 30 (*)    RBC / HPF >50 (*)    Bacteria, UA RARE (*)    All other components within normal limits      EKG None  Radiology CT Renal Stone Study  Result Date: 02/06/2020 CLINICAL DATA:  Right flank pain. EXAM: CT ABDOMEN AND PELVIS WITHOUT CONTRAST TECHNIQUE:  Multidetector CT imaging of the abdomen and pelvis was performed following the standard protocol without IV contrast. COMPARISON:  None. FINDINGS: Lower chest: Clear lung bases. Hepatobiliary: Punctate calcification in the caudate lobe of the liver. Nondistended gallbladder. No biliary dilatation. Pancreas: Unremarkable. Spleen: Unremarkable. Adrenals/Urinary Tract: Mild nodular thickening of both adrenal glands. Discrete 1.2 cm hypoattenuating right adrenal nodule compatible with an adenoma. 5 mm stone in the distal right ureter with mild right hydroureteronephrosis, right renal edema, and moderate perinephric stranding. 1.5 cm stone lying dependently in the right renal pelvis. Multiple small nonobstructing left renal calculi measuring up to 2 mm. 8 mm exophytic low-density lesion arising from the lower pole of the left kidney, likely a cyst. Nondistended bladder. Stomach/Bowel: The stomach is unremarkable. There is no evidence of bowel obstruction or inflammation. Status post appendectomy. Vascular/Lymphatic: Abdominal aortic atherosclerosis without aneurysm. No enlarged lymph nodes. Reproductive: Status post prostatectomy. Other: No intraperitoneal free fluid. Small fat containing bilateral inguinal hernias. Musculoskeletal: No acute osseous abnormality or suspicious osseous lesion. IMPRESSION: 1. 5 mm distal right ureteral stone with mild hydroureteronephrosis. 2. 1.5 cm right renal pelvis stone. 3. Nonobstructing left nephrolithiasis. 4. Small right adrenal adenoma. 5. Aortic Atherosclerosis (ICD10-I70.0). Electronically Signed   By: Sebastian Ache M.D.   On: 02/06/2020 21:37    Procedures Procedures (including critical care time)  Medications Ordered in ED Medications  sodium chloride 0.9 % bolus 1,000 mL (1,000 mLs Intravenous Not Given  02/06/20 2316)    ED Course  I have reviewed the triage vital signs and the nursing notes.  Pertinent labs & imaging results that were available during my care of the patient were reviewed by me and considered in my medical decision making (see chart for details).    MDM Rules/Calculators/A&P                          Patient's CT scan was personally reviewed. Distal right ureteral stone as well as UPJ stone.  At 5 mm hopefully he should feel to pass it.  He does have had acute kidney injury with elevated BUN and creatinine at 1.9.  Chart review indicates from outside records his typical is 1.4-1.5.  Offered IV fluids as he has not drink as much today.  With the elevated BUN this is most consistent with dehydration.  Given only unilateral obstruction seems unlikely directly from the kidney stone.  He declines the IV fluids and wants to push p.o. fluids at home.  This is probably not unreasonable.  No signs/symptoms of acute UTI.  Follow-up with urology.  Will give hydrocodone for pain and asked him to stop his NSAIDs. Final Clinical Impression(s) / ED Diagnoses Final diagnoses:  Right ureteral stone  Acute kidney injury [facility])    Rx / DC Orders ED Discharge Orders         Ordered    HYDROcodone-acetaminophen (NORCO) 5-325 MG tablet  Every 6 hours PRN     Discontinue  Reprint     02/06/20 2238    HYDROcodone-acetaminophen (NORCO/VICODIN) 5-325 MG tablet  Every 4 hours PRN     Discontinue  Reprint     02/06/20 2239           Pricilla Loveless, MD 02/06/20 2333

## 2020-02-07 MED FILL — Hydrocodone-Acetaminophen Tab 5-325 MG: ORAL | Qty: 6 | Status: AC

## 2020-03-24 ENCOUNTER — Ambulatory Visit: Payer: No Typology Code available for payment source | Admitting: Urology

## 2023-02-16 ENCOUNTER — Emergency Department (HOSPITAL_COMMUNITY): Payer: Medicare Other

## 2023-02-16 ENCOUNTER — Other Ambulatory Visit: Payer: Self-pay

## 2023-02-16 ENCOUNTER — Encounter (HOSPITAL_COMMUNITY): Payer: Self-pay | Admitting: Emergency Medicine

## 2023-02-16 ENCOUNTER — Emergency Department (HOSPITAL_COMMUNITY)
Admission: EM | Admit: 2023-02-16 | Discharge: 2023-02-16 | Disposition: A | Discharge status: Home / Self Care | Payer: Medicare Other | Attending: Emergency Medicine | Admitting: Emergency Medicine

## 2023-02-16 DIAGNOSIS — Z7982 Long term (current) use of aspirin: Secondary | ICD-10-CM | POA: Diagnosis not present

## 2023-02-16 DIAGNOSIS — R109 Unspecified abdominal pain: Secondary | ICD-10-CM | POA: Diagnosis present

## 2023-02-16 DIAGNOSIS — Z794 Long term (current) use of insulin: Secondary | ICD-10-CM | POA: Insufficient documentation

## 2023-02-16 DIAGNOSIS — N132 Hydronephrosis with renal and ureteral calculous obstruction: Secondary | ICD-10-CM | POA: Diagnosis not present

## 2023-02-16 DIAGNOSIS — R944 Abnormal results of kidney function studies: Secondary | ICD-10-CM | POA: Insufficient documentation

## 2023-02-16 DIAGNOSIS — Z79899 Other long term (current) drug therapy: Secondary | ICD-10-CM | POA: Diagnosis not present

## 2023-02-16 DIAGNOSIS — N201 Calculus of ureter: Secondary | ICD-10-CM

## 2023-02-16 DIAGNOSIS — R7309 Other abnormal glucose: Secondary | ICD-10-CM | POA: Insufficient documentation

## 2023-02-16 LAB — URINALYSIS, ROUTINE W REFLEX MICROSCOPIC
Bacteria, UA: NONE SEEN
Bilirubin Urine: NEGATIVE
Glucose, UA: NEGATIVE mg/dL
Ketones, ur: NEGATIVE mg/dL
Leukocytes,Ua: NEGATIVE
Nitrite: NEGATIVE
Protein, ur: 30 mg/dL — AB
RBC / HPF: >50 RBC/hpf (ref 0–5)
Specific Gravity, Urine: 1.011 (ref 1.005–1.030)
pH: 5 (ref 5.0–8.0)

## 2023-02-16 LAB — CBC WITH DIFFERENTIAL/PLATELET
Abs Immature Granulocytes: 0.07 10*3/uL (ref 0.00–0.07)
Basophils Absolute: 0 10*3/uL (ref 0.0–0.1)
Basophils Relative: 1 %
Eosinophils Absolute: 0.1 10*3/uL (ref 0.0–0.5)
Eosinophils Relative: 1 %
HCT: 39.1 % (ref 39.0–52.0)
Hemoglobin: 13.1 g/dL (ref 13.0–17.0)
Immature Granulocytes: 1 %
Lymphocytes Relative: 13 %
Lymphs Abs: 1.1 10*3/uL (ref 0.7–4.0)
MCH: 32.3 pg (ref 26.0–34.0)
MCHC: 33.5 g/dL (ref 30.0–36.0)
MCV: 96.5 fL (ref 80.0–100.0)
Monocytes Absolute: 0.7 10*3/uL (ref 0.1–1.0)
Monocytes Relative: 9 %
Neutro Abs: 6.1 10*3/uL (ref 1.7–7.7)
Neutrophils Relative %: 75 %
Platelets: 119 10*3/uL — ABNORMAL LOW (ref 150–400)
RBC: 4.05 MIL/uL — ABNORMAL LOW (ref 4.22–5.81)
RDW: 14 % (ref 11.5–15.5)
WBC: 8.1 10*3/uL (ref 4.0–10.5)
nRBC: 0 % (ref 0.0–0.2)

## 2023-02-16 LAB — BASIC METABOLIC PANEL
Anion gap: 10 (ref 5–15)
BUN: 31 mg/dL — ABNORMAL HIGH (ref 8–23)
CO2: 24 mmol/L (ref 22–32)
Calcium: 9.4 mg/dL (ref 8.9–10.3)
Chloride: 103 mmol/L (ref 98–111)
Creatinine, Ser: 1.6 mg/dL — ABNORMAL HIGH (ref 0.61–1.24)
GFR, Estimated: 44 mL/min — ABNORMAL LOW (ref >60)
Glucose, Bld: 147 mg/dL — ABNORMAL HIGH (ref 70–99)
Potassium: 4.1 mmol/L (ref 3.5–5.1)
Sodium: 137 mmol/L (ref 135–145)

## 2023-02-16 MED ORDER — TAMSULOSIN HCL 0.4 MG PO CAPS: 0.4000 mg | ORAL_CAPSULE | Freq: Every day | ORAL | 0 refills | Status: AC

## 2023-02-16 MED ORDER — HYDROCODONE-ACETAMINOPHEN 5-325 MG PO TABS: 1.0000 | ORAL_TABLET | Freq: Four times a day (QID) | ORAL | 0 refills | Status: AC | PRN

## 2023-02-16 NOTE — ED Triage Notes (Signed)
Pt c/o left flank pain x 2 days with hematuria. Previous hx kidney stones and feels like this may be the same. Denies n/v. Pain rated 7/10 last night but feels somewhat better at time of triage.

## 2023-02-16 NOTE — ED Provider Notes (Signed)
[place] [facility] AT [facility] Provider Note   CSN: [id number] Arrival date & time: 02/16/23  1157     History  Chief Complaint  Patient presents with   Flank Pain    Francisco Atkins is a 82 y.o. male.  He has a history of kidney stones.  Complaining of some left flank pain that started last evening.  Associated with a little bit of urinary discoloration.  Pain is improved today but he wants to make sure he is some pain medicine in case it comes back.  No fevers chills nausea vomiting.  The history is provided by the patient.  Flank Pain This is a recurrent problem. The current episode started yesterday. The problem has been gradually improving. Pertinent negatives include no chest pain, no abdominal pain, no headaches and no shortness of breath. Nothing aggravates the symptoms. Nothing relieves the symptoms. He has tried rest for the symptoms. The treatment provided moderate relief.       Home Medications Prior to Admission medications   Medication Sig Start Date End Date Taking? Authorizing Provider  aspirin 81 MG chewable tablet Chew 81 mg by mouth daily.    [provider]  atorvastatin (LIPITOR) 40 MG tablet Take 40 mg by mouth every morning.  04/06/14   [provider]  carbidopa-levodopa (SINEMET) 25-100 MG per tablet Take 1 tablet by mouth at bedtime as needed (restless legs). Very seldom takes    [provider]  cholecalciferol (VITAMIN D) 1000 UNITS tablet Take 1,000 Units by mouth daily.    [provider]  etanercept (ENBREL) 50 MG/ML injection Inject 50 mg into the skin every Saturday.    [provider]  ferrous sulfate 325 (65 FE) MG tablet Take 325 mg by mouth 2 (two) times daily with a meal.    [provider]  folic acid (FOLVITE) 1 MG tablet Take 1 mg by mouth every morning.     [provider]  HUMALOG KWIKPEN 100 UNIT/ML KwikPen Inject 5-17 Units into the skin in the morning, at  noon, and at bedtime. 5 after breakfast, 12 at lunch, and 17 at dinner. 10/26/19   [provider]  hydrochlorothiazide (HYDRODIURIL) 25 MG tablet Take 25 mg by mouth every morning.     [provider]  HYDROcodone-acetaminophen (NORCO) 5-325 MG tablet Take 1 tablet by mouth every 6 (six) hours as needed for severe pain. 02/06/20   Pricilla Loveless, MD  HYDROcodone-acetaminophen (NORCO/VICODIN) 5-325 MG tablet Take 2 tablets by mouth every 4 (four) hours as needed. 02/06/20   Pricilla Loveless, MD  insulin glargine (LANTUS) 100 UNIT/ML injection Inject 57 Units into the skin at bedtime.     [provider]  metFORMIN (GLUCOPHAGE) 1000 MG tablet Take 1,000 mg by mouth 2 (two) times daily with a meal.    [provider]  methotrexate (RHEUMATREX) 2.5 MG tablet Take 10 mg by mouth every Monday.     [provider]  quinapril (ACCUPRIL) 40 MG tablet Take 40 mg by mouth at bedtime.    [provider]  solifenacin (VESICARE) 5 MG tablet Take 5 mg by mouth every morning.    [provider]      Allergies    Lisinopril    Review of Systems   Review of Systems  Constitutional:  Negative for fever.  Respiratory:  Negative for shortness of breath.   Cardiovascular:  Negative for chest pain.  Gastrointestinal:  Negative for abdominal pain.  Genitourinary:  Positive for flank pain. Negative for dysuria.  Neurological:  Negative for headaches.    Physical Exam Updated Vital Signs BP (!) 159/79   Pulse 94   Temp 98.5 F (36.9 C)   Resp 16   Ht 5\' 6"  (1.676 m)   Wt 81.6 kg   SpO2 96%   BMI 29.05 kg/m  Physical Exam Vitals and nursing note reviewed.  Constitutional:      General: He is not in acute distress.    Appearance: Normal appearance. He is well-developed.  HENT:     Head: Normocephalic and atraumatic.  Eyes:     Conjunctiva/sclera: Conjunctivae normal.  Cardiovascular:     Rate and Rhythm: Normal rate and regular rhythm.      Heart sounds: No murmur heard. Pulmonary:     Effort: Pulmonary effort is normal. No respiratory distress.     Breath sounds: Normal breath sounds.  Abdominal:     Palpations: Abdomen is soft.     Tenderness: There is no abdominal tenderness. There is no guarding or rebound.  Musculoskeletal:        General: No swelling.     Cervical back: Neck supple.  Skin:    General: Skin is warm and dry.     Capillary Refill: Capillary refill takes less than 2 seconds.  Neurological:     General: No focal deficit present.     Mental Status: He is alert.     Motor: No weakness.     Gait: Gait normal.     ED Results / Procedures / Treatments   Labs (all labs ordered are listed, but only abnormal results are displayed) Labs Reviewed  URINALYSIS, ROUTINE W REFLEX MICROSCOPIC - Abnormal; Notable for the following components:      Result Value   Color, Urine STRAW (*)    Hgb urine dipstick LARGE (*)    Protein, ur 30 (*)    All other components within normal limits  BASIC METABOLIC PANEL - Abnormal; Notable for the following components:   Glucose, Bld 147 (*)    BUN 31 (*)    Creatinine, Ser 1.60 (*)    GFR, Estimated 44 (*)    All other components within normal limits  CBC WITH DIFFERENTIAL/PLATELET - Abnormal; Notable for the following components:   RBC 4.05 (*)    Platelets 119 (*)    All other components within normal limits    EKG None  Radiology CT Renal Stone Study  Result Date: 02/16/2023 CLINICAL DATA:  Abdominal/flank pain. Left-sided symptoms. Stone suspected. EXAM: CT ABDOMEN AND PELVIS WITHOUT CONTRAST TECHNIQUE: Multidetector CT imaging of the abdomen and pelvis was performed following the standard protocol without IV contrast. RADIATION DOSE REDUCTION: This exam was performed according to the departmental dose-optimization program which includes automated exposure control, adjustment of the mA and/or kV according to patient size and/or use of iterative reconstruction  technique. COMPARISON:  02/06/2020 FINDINGS: Lower chest: Lung bases are clear. No pleural or pericardial fluid. Few tiny foci of emphysema. Hepatobiliary: Liver parenchyma is normal without contrast. No calcified gallstones. Pancreas: Normal Spleen: Normal Adrenals/Urinary Tract: Adrenal glands are normal. Left kidney shows several nonobstructing calculi, 4 mm or smaller. The left ureter is dilated to the UVJ where there is a 5 x 7 mm stone no stone in the bladder. On the right, there are a few nonobstructing stones, the largest measuring 5 mm in the lower pole. No passing stone on the right. Stomach/Bowel: Stomach and small intestine  are normal. No abnormal colon finding. Vascular/Lymphatic: Aortic atherosclerosis. No aneurysm. IVC is normal. No adenopathy. Reproductive: Normal Other: No free fluid or air. Musculoskeletal: Ordinary mild lumbar degenerative changes. IMPRESSION: 1. 5 x 7 mm stone at the left UVJ with mild left hydroureteronephrosis. 2. Several nonobstructing stones in each kidney. 3. Aortic atherosclerosis. Aortic Atherosclerosis (ICD10-I70.0). Electronically Signed   By: Paulina Fusi M.D.   On: 02/16/2023 14:05    Procedures Procedures    Medications Ordered in ED Medications - No data to display  ED Course/ Medical Decision Making/ A&P Clinical Course as of 02/16/23 1740  Wed Feb 16, 2023  1357 CT interpreted by me as 2 to 3 mm distal left ureteral stone.  Awaiting radiology reading. [MB]  1402 Creatinine elevated at 1.6 although better than 1.9 from last results 2 years ago.  Urine with hematuria no gross signs of infection.  No white count no fever. [MB]    Clinical Course User Index [MB] Terrilee Files, MD                             Medical Decision Making Amount and/or Complexity of Data Reviewed Labs: ordered. Radiology: ordered.  Risk Prescription drug management.   This patient complains of left flank pain; this involves an extensive number of  treatment Options and is a complaint that carries with it a high risk of complications and morbidity. The differential includes colic, pyelonephritis, diverticulitis, retroperitoneal bleed, vascular  I ordered, reviewed and interpreted labs, which included CBC with normal white count normal hemoglobin, new platelets being low, chemistries with elevated glucose elevated creatinine similar to priors, urinalysis with hematuria no signs of infection I ordered imaging studies which included CT renal and I independently    visualized and interpreted imaging which showed 5 to 7 mm distal left ureteral stone Additional history obtained from patient's wife Previous records obtained and reviewed in epic including recent nephrology notes Social determinants considered, no significant barriers Critical Interventions: None  After the interventions stated above, I reevaluated the patient and found patient to be pain-free and nontoxic-appearing Admission and further testing considered, no indications for admission at this time.  He has a urology appointment on Monday.  Will give prescription for pain medication and Flomax.  Return instructions discussed.         Final Clinical Impression(s) / ED Diagnoses Final diagnoses:  Ureterolithiasis    Rx / DC Orders ED Discharge Orders          Ordered    HYDROcodone-acetaminophen (NORCO) 5-325 MG tablet  Every 6 hours PRN        02/16/23 1417    tamsulosin (FLOMAX) 0.4 MG CAPS capsule  Daily        02/16/23 1418              Terrilee Files, MD 02/16/23 1742

## 2023-02-16 NOTE — Discharge Instructions (Signed)
You were seen in the emergency ferment for left flank pain.  You had a CAT scan that showed a 5 mm x 7 mm UVJ stone on the left.  This is likely the cause of your pain.  We are prescribing a short course of some narcotic pain medication.  Please use caution with this as may make you constipated or dizzy.  Follow-up with your urologist on Monday as scheduled.  Drink plenty of fluids.  Return to the emergency department if any worsening or concerning symptoms.
